# Patient Record
Sex: Male | Born: 2000 | Race: Black or African American | Hispanic: No | Marital: Single | Smoking: Former smoker
Health system: Southern US, Community
[De-identification: ages and names within clinical notes are randomized; demographics above are authoritative.]

## PROBLEM LIST (undated history)

## (undated) DIAGNOSIS — J45909 Unspecified asthma, uncomplicated: Secondary | ICD-10-CM

---

## 2021-04-03 ENCOUNTER — Emergency Department: Payer: Self-pay

## 2021-04-03 ENCOUNTER — Other Ambulatory Visit: Payer: Self-pay

## 2021-04-03 ENCOUNTER — Encounter: Payer: Self-pay | Admitting: Emergency Medicine

## 2021-04-03 ENCOUNTER — Emergency Department
Admission: EM | Admit: 2021-04-03 | Discharge: 2021-04-03 | Disposition: A | Discharge status: Home / Self Care | Payer: Self-pay | Attending: Emergency Medicine | Admitting: Emergency Medicine

## 2021-04-03 DIAGNOSIS — Z87891 Personal history of nicotine dependence: Secondary | ICD-10-CM | POA: Insufficient documentation

## 2021-04-03 DIAGNOSIS — J4521 Mild intermittent asthma with (acute) exacerbation: Secondary | ICD-10-CM | POA: Insufficient documentation

## 2021-04-03 HISTORY — DX: Unspecified asthma, uncomplicated: J45.909

## 2021-04-03 LAB — BASIC METABOLIC PANEL
Anion gap: 8 (ref 5–15)
BUN: 14 mg/dL (ref 6–20)
CO2: 23 mmol/L (ref 22–32)
Calcium: 9.7 mg/dL (ref 8.9–10.3)
Chloride: 102 mmol/L (ref 98–111)
Creatinine, Ser: 0.94 mg/dL (ref 0.61–1.24)
GFR, Estimated: >60 mL/min (ref >60)
Glucose, Bld: 115 mg/dL — ABNORMAL HIGH (ref 70–99)
Potassium: 3.5 mmol/L (ref 3.5–5.1)
Sodium: 133 mmol/L — ABNORMAL LOW (ref 135–145)

## 2021-04-03 LAB — CBC
HCT: 42.1 % (ref 39.0–52.0)
Hemoglobin: 14.4 g/dL (ref 13.0–17.0)
MCH: 30.2 pg (ref 26.0–34.0)
MCHC: 34.2 g/dL (ref 30.0–36.0)
MCV: 88.3 fL (ref 80.0–100.0)
Platelets: 339 10*3/uL (ref 150–400)
RBC: 4.77 MIL/uL (ref 4.22–5.81)
RDW: 12.3 % (ref 11.5–15.5)
WBC: 7.5 10*3/uL (ref 4.0–10.5)
nRBC: 0 % (ref 0.0–0.2)

## 2021-04-03 LAB — TROPONIN I (HIGH SENSITIVITY): Troponin I (High Sensitivity): 5 ng/L (ref <18)

## 2021-04-03 MED ORDER — DEXAMETHASONE 10 MG/ML FOR PEDIATRIC ORAL USE
10.0000 mg | Freq: Once | INTRAMUSCULAR | Status: AC
  Administered 2021-04-03: 05:00:00 10 mg via ORAL
  Filled 2021-04-03: qty 1

## 2021-04-03 MED ORDER — ALBUTEROL SULFATE HFA 108 (90 BASE) MCG/ACT IN AERS
2.0000 | INHALATION_SPRAY | RESPIRATORY_TRACT | Status: DC | PRN
  Administered 2021-04-03: 03:00:00 2 via RESPIRATORY_TRACT
  Filled 2021-04-03: qty 6.7

## 2021-04-03 MED ORDER — ALBUTEROL SULFATE HFA 108 (90 BASE) MCG/ACT IN AERS: INHALATION_SPRAY | RESPIRATORY_TRACT | 0 refills | Status: AC

## 2021-04-03 NOTE — ED Triage Notes (Signed)
Pt in with sharp, central cp along with sob that began about 30 min PTA while he was at work. States he smelt, "something weird" and he thinks it triggered his asthma. Pain to chest worse with palpitation and deep breaths.

## 2021-04-03 NOTE — ED Provider Notes (Signed)
[facility] Emergency Department Provider Note  ____________________________________________   Event Date/Time   First MD Initiated Contact with Patient 04/03/21 [phone]     (approximate)  I have reviewed the triage vital signs and the nursing notes.   HISTORY  Chief Complaint Shortness of Breath and Chest Pain    HPI Brandon Everett is a 20 y.o. male with a history of asthma who presents for evaluation of acute onset shortness of breath and chest tightness.  It occurred while he was at work after he smelled something that another coworker had and he thinks may have triggered an asthma attack.  He no longer had an albuterol inhaler and has not had problems with his asthma for a while but he has had it since he was a child.  He still felt bad when he came to the emergency department but he was given an albuterol inhaler and after using it he feels much better.  He no longer has chest pain or tightness.  He no longer feels like he is wheezing.  The albuterol made him feel better, exertion made him feel worse.  He denies recent fever, sore throat, nausea, vomiting, and abdominal pain.     Past Medical History:  Diagnosis Date   Asthma     There are no problems to display for this patient.   History reviewed. No pertinent surgical history.  Prior to Admission medications   Medication Sig Start Date End Date Taking? Authorizing Provider  albuterol (VENTOLIN HFA) 108 (90 Base) MCG/ACT inhaler Inhale 2-4 puffs by mouth every 4 hours as needed for wheezing, cough, and/or shortness of breath 04/03/21  Yes Loleta Rose, MD    Allergies Penicillins  No family history on file.  Social History Social History   Tobacco Use   Smoking status: Former    Types: Cigarettes   Smokeless tobacco: Never  Vaping Use   Vaping Use: Every day  Substance Use Topics   Alcohol use: Yes    Comment: occasional    Review of Systems Constitutional: No  fever/chills Eyes: No visual changes. ENT: No sore throat. Cardiovascular: Acute onset chest tightness associated with shortness of breath.   GI:  No abd pain, no N/V Respiratory: Acute onset shortness of breath. Neurological: Negative for headaches, focal weakness or numbness.   ____________________________________________   PHYSICAL EXAM:  VITAL SIGNS: ED Triage Vitals  Enc Vitals Group     BP 04/03/21 0047 136/86     Pulse Rate 04/03/21 0047 71     Resp 04/03/21 0047 20     Temp 04/03/21 0047 98.9 F (37.2 C)     Temp Source 04/03/21 0047 Axillary     SpO2 04/03/21 0047 98 %     Weight 04/03/21 0050 104.3 kg (230 lb)     Height --      Head Circumference --      Peak Flow --      Pain Score --      Pain Loc --      Pain Edu? --      Excl. in GC? --     Constitutional: Alert and oriented.  Eyes: Conjunctivae are normal.  Cardiovascular: Normal rate, regular rhythm. Good peripheral circulation. Respiratory: Normal respiratory effort.  No retractions. Gastrointestinal: Soft and nontender. No distention.  Musculoskeletal: No lower extremity tenderness nor edema. No gross deformities of extremities. Neurologic:  Normal speech and language. No gross focal neurologic deficits are appreciated.  Skin:  Skin is  warm, dry and intact. Psychiatric: Mood and affect are normal. Speech and behavior are normal.  ____________________________________________   LABS (all labs ordered are listed, but only abnormal results are displayed)  Labs Reviewed  BASIC METABOLIC PANEL - Abnormal; Notable for the following components:      Result Value   Sodium 133 (*)    Glucose, Bld 115 (*)    All other components within normal limits  CBC  TROPONIN I (HIGH SENSITIVITY)   ____________________________________________  EKG  ED ECG REPORT I, Loleta Rose, the attending physician, personally viewed and interpreted this ECG.  Date: 04/03/2021 EKG Time: 00:52 Rate: 68 Rhythm: normal  sinus rhythm QRS Axis: normal Intervals: normal ST/T Wave abnormalities: normal Narrative Interpretation: no evidence of acute ischemia  ____________________________________________  RADIOLOGY I, Loleta Rose, personally viewed and evaluated these images (plain radiographs) as part of my medical decision making, as well as reviewing the written report by the radiologist.  ED MD interpretation: No acute abnormalities on chest x-ray  Official radiology report(s): DG Chest 2 View  Result Date: 04/03/2021 CLINICAL DATA:  Chest pain and shortness of breath. EXAM: CHEST - 2 VIEW COMPARISON:  None. FINDINGS: The heart size and mediastinal contours are within normal limits. Mildly decreased lung volumes are seen. Both lungs are clear. The visualized skeletal structures are unremarkable. IMPRESSION: No active cardiopulmonary disease. Electronically Signed   By: Aram Candela M.D.   On: 04/03/2021 01:15    ____________________________________________   INITIAL IMPRESSION / MDM / ASSESSMENT AND PLAN / ED COURSE  As part of my medical decision making, I reviewed the following data within the electronic MEDICAL RECORD NUMBER Nursing notes reviewed and incorporated, Labs reviewed , EKG interpreted , Old chart reviewed, Radiograph reviewed , and Notes from prior ED visits   Differential diagnosis includes, but is not limited to, asthma exacerbation, chemical pneumonitis, viral infection.  Patient is well-appearing and in no distress.  He was asymptomatic prior to experiencing a triggering smell and then he felt like he had an asthma exacerbation.  He feels completely better after albuterol with an inhaler which he will be able to take home with him.  Vital signs are stable and within normal limits.  EKG shows no sign of ischemia.  I personally reviewed the patient's imaging and agree with the radiologist's interpretation that there are no acute abnormalities on his chest x-ray.  Basic metabolic panel,  CBC, and high-sensitivity troponin are within normal limits.  Patient is comfortable with the plan for discharge and outpatient follow-up and I provided to him with some clinic names with which she can establish primary care.  I gave a dose of Decadron 10 mg by mouth prior to discharge to help with what may be a mild intermittent asthma exacerbation.  I gave my usual and customary return precautions and he understands and agrees with the plan.           ____________________________________________  FINAL CLINICAL IMPRESSION(S) / ED DIAGNOSES  Final diagnoses:  Mild intermittent asthma with exacerbation     MEDICATIONS GIVEN DURING THIS VISIT:  Medications  albuterol (VENTOLIN HFA) 108 (90 Base) MCG/ACT inhaler 2 puff (2 puffs Inhalation Given 04/03/21 0255)  dexamethasone (DECADRON) 10 MG/ML injection for Pediatric ORAL use 10 mg (10 mg Oral Given 04/03/21 0450)     ED Discharge Orders          Ordered    albuterol (VENTOLIN HFA) 108 (90 Base) MCG/ACT inhaler       Note  to Pharmacy: Pharmacy may substitute brand and size for insurance-approved equivalent   04/03/21 0446             Note:  This document was prepared using Dragon voice recognition software and may include unintentional dictation errors.   Loleta Rose, MD 04/03/21 [phone]

## 2021-05-12 ENCOUNTER — Ambulatory Visit: Payer: Self-pay

## 2021-05-16 ENCOUNTER — Encounter: Payer: Self-pay | Admitting: Emergency Medicine

## 2021-05-16 ENCOUNTER — Other Ambulatory Visit: Payer: Self-pay

## 2021-05-16 ENCOUNTER — Emergency Department
Admission: EM | Admit: 2021-05-16 | Discharge: 2021-05-16 | Disposition: A | Discharge status: Home / Self Care | Payer: Self-pay | Attending: Emergency Medicine | Admitting: Emergency Medicine

## 2021-05-16 DIAGNOSIS — L03213 Periorbital cellulitis: Secondary | ICD-10-CM | POA: Insufficient documentation

## 2021-05-16 DIAGNOSIS — J45909 Unspecified asthma, uncomplicated: Secondary | ICD-10-CM | POA: Insufficient documentation

## 2021-05-16 MED ORDER — SULFAMETHOXAZOLE-TRIMETHOPRIM 800-160 MG PO TABS: 1.0000 | ORAL_TABLET | Freq: Two times a day (BID) | ORAL | 0 refills | Status: AC

## 2021-05-16 MED ORDER — AMOXICILLIN-POT CLAVULANATE 875-125 MG PO TABS: 1.0000 | ORAL_TABLET | Freq: Two times a day (BID) | ORAL | 0 refills | Status: AC

## 2021-05-16 MED ORDER — AMOXICILLIN-POT CLAVULANATE 875-125 MG PO TABS
1.0000 | ORAL_TABLET | Freq: Once | ORAL | Status: AC
  Administered 2021-05-16: 1 via ORAL
  Filled 2021-05-16: qty 1

## 2021-05-16 MED ORDER — SULFAMETHOXAZOLE-TRIMETHOPRIM 800-160 MG PO TABS
1.0000 | ORAL_TABLET | Freq: Once | ORAL | Status: AC
  Administered 2021-05-16: 1 via ORAL
  Filled 2021-05-16: qty 1

## 2021-05-16 NOTE — ED Notes (Signed)
See triage note  presents with swelling to right eye and cheek area  states he popped a "bump" under eye yesterday  woke up with swelling to eye this am   afebrile on arrival

## 2021-05-16 NOTE — Discharge Instructions (Addendum)
You likely have a infection of the skin around the eye. Please take the 2 antibiotics twice a day for the next 5 days.  Please also try warm compresses on the eye.  If the redness is increasing or you develop any double vision, please return to the emergency department.

## 2021-05-16 NOTE — ED Triage Notes (Signed)
Pt via POV from home. Pt c/o eye swelling, pt states he popped a bump on his eye last night. Pt woke up with L eye swelling. No redness in the eye noted. Pt states it is causing pain but denies any vision changes. Pt is A&Ox4 and NAD

## 2021-05-16 NOTE — ED Provider Notes (Signed)
[facility] Provider Note    Event Date/Time   First MD Initiated Contact with Patient 05/16/21 [phone]     (approximate)   History   Eye Problem   HPI  Brandon Everett is a 21 y.o. male  with pmh of asthma who presents with eye pain and swelling.  Patient popped what he thought was a pimple on the right cheek last night.  This morning he woke up and the right eye was swollen.  Denies visual change.  There is no significant pain associated with it.  Denies fevers or chills.  He notes that when he did pop the pimple there was pus.    Past Medical History:  Diagnosis Date   Asthma     There are no problems to display for this patient.    Physical Exam  Triage Vital Signs: ED Triage Vitals  Enc Vitals Group     BP 05/16/21 0931 (!) 144/83     Pulse Rate 05/16/21 0931 74     Resp 05/16/21 0931 20     Temp 05/16/21 0931 98.4 F (36.9 C)     Temp Source 05/16/21 0931 Oral     SpO2 05/16/21 0931 98 %     Weight 05/16/21 0930 230 lb (104.3 kg)     Height 05/16/21 0930 5\' 8"  (1.727 m)     Head Circumference --      Peak Flow --      Pain Score 05/16/21 0930 7     Pain Loc --      Pain Edu? --      Excl. in [place]? --     Most recent vital signs: Vitals:   05/16/21 0931  BP: (!) 144/83  Pulse: 74  Resp: 20  Temp: 98.4 F (36.9 C)  SpO2: 98%     General: Awake, no distress.  CV:  Good peripheral perfusion.  Resp:  Normal effort.  Abd:  No distention.  Neuro:             Awake, Alert, Oriented x 3  Other:  Periorbital swelling on the right side, no significant erythema or warmth, no conjunctival injection, PERRLA, extraocular movements are intact, no pain with extraocular movements, no proptosis   ED Results / Procedures / Treatments  Labs (all labs ordered are listed, but only abnormal results are displayed) Labs Reviewed - No data to display   EKG     RADIOLOGY    PROCEDURES:  Critical Care performed: No    MEDICATIONS  ORDERED IN ED: Medications  sulfamethoxazole-trimethoprim (BACTRIM DS) 800-160 MG per tablet 1 tablet (has no administration in time range)  amoxicillin-clavulanate (AUGMENTIN) 875-125 MG per tablet 1 tablet (1 tablet Oral Given 05/16/21 0952)     IMPRESSION / MDM / ASSESSMENT AND PLAN / ED COURSE  I reviewed the triage vital signs and the nursing notes.                              Differential diagnosis includes, but is not limited to, preseptal cellulitis, orbital cellulitis, allergic reaction  21 year old male presents with right eye swelling.  He popped the pimple that was adjacent to the eye on the right cheek last night and then woke up with right eye swelling today.  On exam he does have periorbital swelling without significant erythema or tenderness.  There is no proptosis pain with extraocular movements or restricted extraocular movements.  Exam is most consistent with periorbital cellulitis.  I am not concerned for orbital cellulitis at this point.  Reviewed up-to-date recommendations for antibiotics, given he did have potential periorbital skin trauma I do recommend double coverage with Bactrim and Augmentin.  Patient has penicillin allergy listed but he was just told that he had this as a child does not recall or know what the reaction was.  We will give him a dose of Augmentin in the ED and observed.  Will discharge with 7 days of Bactrim and Augmentin.  We discussed return precautions for worsening despite antibiotics.     FINAL CLINICAL IMPRESSION(S) / ED DIAGNOSES   Final diagnoses:  Periorbital cellulitis of right eye     Rx / DC Orders   ED Discharge Orders     None        Note:  This document was prepared using Dragon voice recognition software and may include unintentional dictation errors.   Rada Hay, MD 05/16/21 [phone]

## 2021-10-13 ENCOUNTER — Other Ambulatory Visit: Payer: Self-pay

## 2021-10-13 ENCOUNTER — Emergency Department
Admission: EM | Admit: 2021-10-13 | Discharge: 2021-10-13 | Disposition: A | Discharge status: Home / Self Care | Payer: Self-pay | Attending: Emergency Medicine | Admitting: Emergency Medicine

## 2021-10-13 ENCOUNTER — Encounter: Payer: Self-pay | Admitting: *Deleted

## 2021-10-13 DIAGNOSIS — H6121 Impacted cerumen, right ear: Secondary | ICD-10-CM | POA: Insufficient documentation

## 2021-10-13 DIAGNOSIS — L0232 Furuncle of buttock: Secondary | ICD-10-CM | POA: Insufficient documentation

## 2021-10-13 DIAGNOSIS — H9201 Otalgia, right ear: Secondary | ICD-10-CM

## 2021-10-13 MED ORDER — SULFAMETHOXAZOLE-TRIMETHOPRIM 800-160 MG PO TABS
1.0000 | ORAL_TABLET | Freq: Once | ORAL | Status: AC
  Administered 2021-10-13: 1 via ORAL
  Filled 2021-10-13: qty 1

## 2021-10-13 MED ORDER — SULFAMETHOXAZOLE-TRIMETHOPRIM 800-160 MG PO TABS: 1.0000 | ORAL_TABLET | Freq: Two times a day (BID) | ORAL | 0 refills | Status: AC

## 2021-10-13 NOTE — Discharge Instructions (Signed)
Please soak in Epsom salt baths daily.  Take antibiotics as prescribed.  Follow-up with general surgery if no improvement in 1 week.  Use mineral oil to soften wax in your right ear and flush daily with warm water.  Return to the ER for any worsening symptoms or urgent changes in your health.

## 2021-10-13 NOTE — ED Triage Notes (Signed)
Pt has right earache.  Pt has keloid on right buttock and states he is having a flare up.  Pt alert

## 2021-10-13 NOTE — ED Provider Notes (Signed)
[facility] EMERGENCY DEPARTMENT Provider Note   CSN: [id number] Arrival date & time: 10/13/21  1513     History  No chief complaint on file.   Brandon Everett is a 21 y.o. male.  Presents to the emergency department for evaluation of painful keloids on his buttocks.  Patient states he had some cyst removed on his buttocks several years ago in [place], has had recurring swelling along the area of the surgery, states he has keloid formation.  Sometimes these are sore and bleeding but they never go away.  He denies any fevers, has had some bloody drainage to both areas.  Mild tenderness to touch without any significant redness.  He denies any purulent drainage.  He also complains of right ear discomfort, has pain in the right ear with no trauma or injury.  Denies any rashes.  No fevers or chills.  No ear drainage.  States he feels as if there is something in his right ear canal.  HPI     Home Medications Prior to Admission medications   Medication Sig Start Date End Date Taking? Authorizing Provider  sulfamethoxazole-trimethoprim (BACTRIM DS) 800-160 MG tablet Take 1 tablet by mouth 2 (two) times daily for 7 days. 10/13/21 10/20/21 Yes Evon Slack, PA-C  albuterol (VENTOLIN HFA) 108 (90 Base) MCG/ACT inhaler Inhale 2-4 puffs by mouth every 4 hours as needed for wheezing, cough, and/or shortness of breath 04/03/21   Loleta Rose, MD      Allergies    Penicillins    Review of Systems   Review of Systems  Physical Exam Updated Vital Signs BP (!) 129/91 (BP Location: Left Arm)   Pulse 85   Temp 98.2 F (36.8 C) (Oral)   Resp 17   Ht 5\' 8"  (1.727 m)   Wt 104.3 kg   SpO2 97%   BMI 34.97 kg/m  Physical Exam Constitutional:      Appearance: He is well-developed.  HENT:     Head: Normocephalic and atraumatic.     Ears:     Comments: Right TM normal, moderate impaction of hard cerumen.  Canal normal.  Left ear canal normal with normal TM. Eyes:      Conjunctiva/sclera: Conjunctivae normal.  Cardiovascular:     Rate and Rhythm: Normal rate.  Pulmonary:     Effort: Pulmonary effort is normal. No respiratory distress.  Genitourinary:    Comments: Buttocks with a left gluteal and right gluteal soft tissue mass that is slightly fluctuant and tender to palpation, minimal surrounding erythema with no significant warmth.  Minimal bloody drainage, no purulent drainage.  No perirectal abscesses Musculoskeletal:        General: Normal range of motion.     Cervical back: Normal range of motion.  Skin:    General: Skin is warm.     Findings: No rash.  Neurological:     Mental Status: He is alert and oriented to person, place, and time.  Psychiatric:        Behavior: Behavior normal.        Thought Content: Thought content normal.     ED Results / Procedures / Treatments   Labs (all labs ordered are listed, but only abnormal results are displayed) Labs Reviewed - No data to display  EKG None  Radiology No results found.  Procedures Procedures    Medications Ordered in ED Medications  sulfamethoxazole-trimethoprim (BACTRIM DS) 800-160 MG per tablet 1 tablet (has no administration in time range)  ED Course/ Medical Decision Making/ A&P                           Medical Decision Making Risk Prescription drug management.  21 year old male with chronic bilateral gluteal region soft tissue masses that he describes as keloids.  These become sensitive and painful.  Minimal surrounding erythema.  No extensive cellulitis or large abscess present.  Patient placed on oral antibiotics, Bactrim.  He also has right ear discomfort, has a large amount of cerumen impacted in the right ear.  This was 75% removed with warm water irrigation.  Patient tolerated procedure well.  He did see improvement with pain with cerumen removal.  He is educated on continuing with mineral oil and warm water flushing daily to help remove the remainder of the  wax.  Patient with no signs of TM rupture or infection throughout the right ear. Final Clinical Impression(s) / ED Diagnoses Final diagnoses:  Boil of buttock  Right ear pain  Hearing loss due to cerumen impaction, right    Rx / DC Orders ED Discharge Orders          Ordered    sulfamethoxazole-trimethoprim (BACTRIM DS) 800-160 MG tablet  2 times daily        10/13/21 1901              Ronnette Juniper 10/13/21 Jola Babinski, MD 10/13/21 1946

## 2021-10-31 ENCOUNTER — Other Ambulatory Visit: Payer: Self-pay

## 2021-10-31 DIAGNOSIS — Z5321 Procedure and treatment not carried out due to patient leaving prior to being seen by health care provider: Secondary | ICD-10-CM | POA: Insufficient documentation

## 2021-10-31 DIAGNOSIS — L0231 Cutaneous abscess of buttock: Secondary | ICD-10-CM | POA: Insufficient documentation

## 2021-10-31 NOTE — ED Provider Triage Note (Signed)
  Emergency Medicine Provider Triage Evaluation Note  Brandon Everett , a 21 y.o.male,  was evaluated in triage.  Pt complains of multiple abscesses.  Patient states that has been a chronic problem for the past several years.  He is here today because he is now at 1 grow on his buttocks.  He was recently prescribed antibiotics, which she says did not help.  Denies any other symptoms.   Review of Systems  Positive: Abscesses Negative: Denies fever, chest pain, vomiting  Physical Exam   Vitals:   10/31/21 2056  BP: (!) 145/92  Pulse: 74  Resp: 20  Temp: 98.6 F (37 C)  SpO2: 98%   Gen:   Awake, no distress   Resp:  Normal effort  MSK:   Moves extremities without difficulty  Other:    Medical Decision Making  Given the patient's initial medical screening exam, the following diagnostic evaluation has been ordered. The patient will be placed in the appropriate treatment space, once one is available, to complete the evaluation and treatment. I have discussed the plan of care with the patient and I have advised the patient that an ED physician or mid-level practitioner will reevaluate their condition after the test results have been received, as the results may give them additional insight into the type of treatment they may need.    Diagnostics: None immediately.  Treatments: none immediately   Varney Daily, [place] 10/31/21 2110

## 2021-10-31 NOTE — ED Triage Notes (Signed)
Pt has multiple bumps all over. Pt states he gets them all the time he currently has them on his inner thighs, buttocks, forerhead, and behind his ear. Pt states they are painful.

## 2021-11-01 ENCOUNTER — Emergency Department
Admission: EM | Admit: 2021-11-01 | Discharge: 2021-11-01 | Discharge status: Against Medical Advice | Payer: Self-pay | Attending: Emergency Medicine | Admitting: Emergency Medicine

## 2021-11-01 NOTE — ED Notes (Signed)
No answer when called several times from lobby 

## 2021-11-02 ENCOUNTER — Encounter: Payer: Self-pay | Admitting: Emergency Medicine

## 2021-11-02 ENCOUNTER — Other Ambulatory Visit: Payer: Self-pay

## 2021-11-02 ENCOUNTER — Emergency Department
Admission: EM | Admit: 2021-11-02 | Discharge: 2021-11-02 | Disposition: A | Discharge status: Home / Self Care | Payer: Self-pay | Attending: Emergency Medicine | Admitting: Emergency Medicine

## 2021-11-02 DIAGNOSIS — J45909 Unspecified asthma, uncomplicated: Secondary | ICD-10-CM | POA: Insufficient documentation

## 2021-11-02 DIAGNOSIS — Z87891 Personal history of nicotine dependence: Secondary | ICD-10-CM | POA: Insufficient documentation

## 2021-11-02 DIAGNOSIS — L732 Hidradenitis suppurativa: Secondary | ICD-10-CM | POA: Insufficient documentation

## 2021-11-02 MED ORDER — HYDROCODONE-ACETAMINOPHEN 5-325 MG PO TABS: 1.0000 | ORAL_TABLET | Freq: Four times a day (QID) | ORAL | 0 refills | Status: AC | PRN

## 2021-11-02 MED ORDER — CLINDAMYCIN HCL 300 MG PO CAPS: 300.0000 mg | ORAL_CAPSULE | Freq: Three times a day (TID) | ORAL | 0 refills | Status: AC

## 2021-11-02 NOTE — ED Provider Notes (Signed)
[facility] Provider Note    Event Date/Time   First MD Initiated Contact with Patient 11/02/21 0809     (approximate)   History   Abscess   HPI  Brandon Everett is a 21 y.o. male   presents with complaint of multiple boils all over his body.  Patient states this been going on for a long time and he just moved to this area needs to be seen by dermatologist.  He states in the past he has had multiple areas opened and drained.  He denies any fever or chills.  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  Patient has a history of asthma and is a former cigarette smoker.      Physical Exam   Triage Vital Signs: ED Triage Vitals  Enc Vitals Group     BP 11/02/21 0757 128/75     Pulse Rate 11/02/21 0757 63     Resp 11/02/21 0757 18     Temp 11/02/21 0757 98.4 F (36.9 C)     Temp Source 11/02/21 0757 Oral     SpO2 11/02/21 0757 97 %     Weight 11/02/21 0753 229 lb 15 oz (104.3 kg)     Height 11/02/21 0753 5\' 8"  (1.727 m)     Head Circumference --      Peak Flow --      Pain Score 11/02/21 0753 7     Pain Loc --      Pain Edu? --      Excl. in [place]? --     Most recent vital signs: Vitals:   11/02/21 0757 11/02/21 0915  BP: 128/75 124/78  Pulse: 63 68  Resp: 18 16  Temp: 98.4 F (36.9 C)   SpO2: 97% 99%     General: Awake, no distress.  CV:  Good peripheral perfusion.  Resp:  Normal effort.  Abd:  No distention.  Other:  There are multiple chronic appearing papular areas some with scarring on the face, buttocks, upper and lower extremities.  At this time they are all hard, minimal erythema and no fluctuance is noted.  Areas on the buttocks appear to be chronic and have opened in the past.   ED Results / Procedures / Treatments   Labs (all labs ordered are listed, but only abnormal results are displayed) Labs Reviewed - No data to display     PROCEDURES:  Critical Care performed:   Procedures   MEDICATIONS  ORDERED IN ED: Medications - No data to display   IMPRESSION / MDM / ASSESSMENT AND PLAN / ED COURSE  I reviewed the triage vital signs and the nursing notes.   Differential diagnosis includes, but is not limited to, abscess, hydradenitis, folliculitis, contact dermatitis.  21 year old male presents to the ED with chronic history of abscesses over his entire body.  Patient has had several these opened in the past.  He just moved to this area and wishes to be referred to a dermatologist for further treatment of his skin condition.  Areas on his buttocks appear to be chronic but are tender to palpation.  No fluctuance is noted.  We discussed the 2 offices in [place] and that he should call make an appointment.  He is aware that it may be several months before they get him in his he will be a new patient.  A prescription for clindamycin was sent to the pharmacy along with 2 days of hydrocodone as  needed for pain.  He is aware that he cannot drive or operate machinery while taking this medication and is for moderate to severe pain only.  He is encouraged to take Tylenol or ibuprofen if he is working.  Warm moist compresses or sitting in a tub of water.      Patient's presentation is most consistent with acute, uncomplicated illness.  FINAL CLINICAL IMPRESSION(S) / ED DIAGNOSES   Final diagnoses:  Hydradenitis     Rx / DC Orders   ED Discharge Orders          Ordered    clindamycin (CLEOCIN) 300 MG capsule  3 times daily        11/02/21 0901    HYDROcodone-acetaminophen (NORCO/VICODIN) 5-325 MG tablet  Every 6 hours PRN        11/02/21 0901             Note:  This document was prepared using Dragon voice recognition software and may include unintentional dictation errors.   Tommi Rumps, PA-C 11/02/21 1010    Arnaldo Natal, MD 11/02/21 [phone]

## 2021-11-02 NOTE — ED Triage Notes (Signed)
C/O multiple boils to body -- to right buttock, forehead, behind ear.  States areas are painful.  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  Symptoms worsening over past month.

## 2021-11-02 NOTE — ED Notes (Signed)
Pt reports multiple abscesses all over his body x1 year with hx of same since he was a child.

## 2021-11-02 NOTE — Discharge Instructions (Addendum)
Call make an appointment at one of the dermatology offices listed on your discharge papers.  Each dermatology office has more than 1 dermatologist.  It may be a month or 2 before that you get an appointment as you will be a new patient and you need to go ahead and schedule as this is a chronic condition.  You may put warm moist compresses on the largest areas or sit in a tub of water which will also help.  None of these today need to be opened up and drained.  The antibiotic is 3 times a day for the next 10 days.  You may take Tylenol or ibuprofen as needed for pain after finishing the hydrocodone that was sent to the pharmacy for the next 2 days.  Do not drive or operate machinery while taking this medication take only if there is moderate to severe pain.

## 2023-06-09 IMAGING — CR DG CHEST 2V
1 series · 2 of 2 positions shown · non-contrast
Comparison: None.

CLINICAL DATA: Chest pain and shortness of breath.

EXAM:
CHEST - 2 VIEW

[Series 1: dg chest 2 view · 0.14mm/px · 2 of 2 slices shown]
[im 1/2]
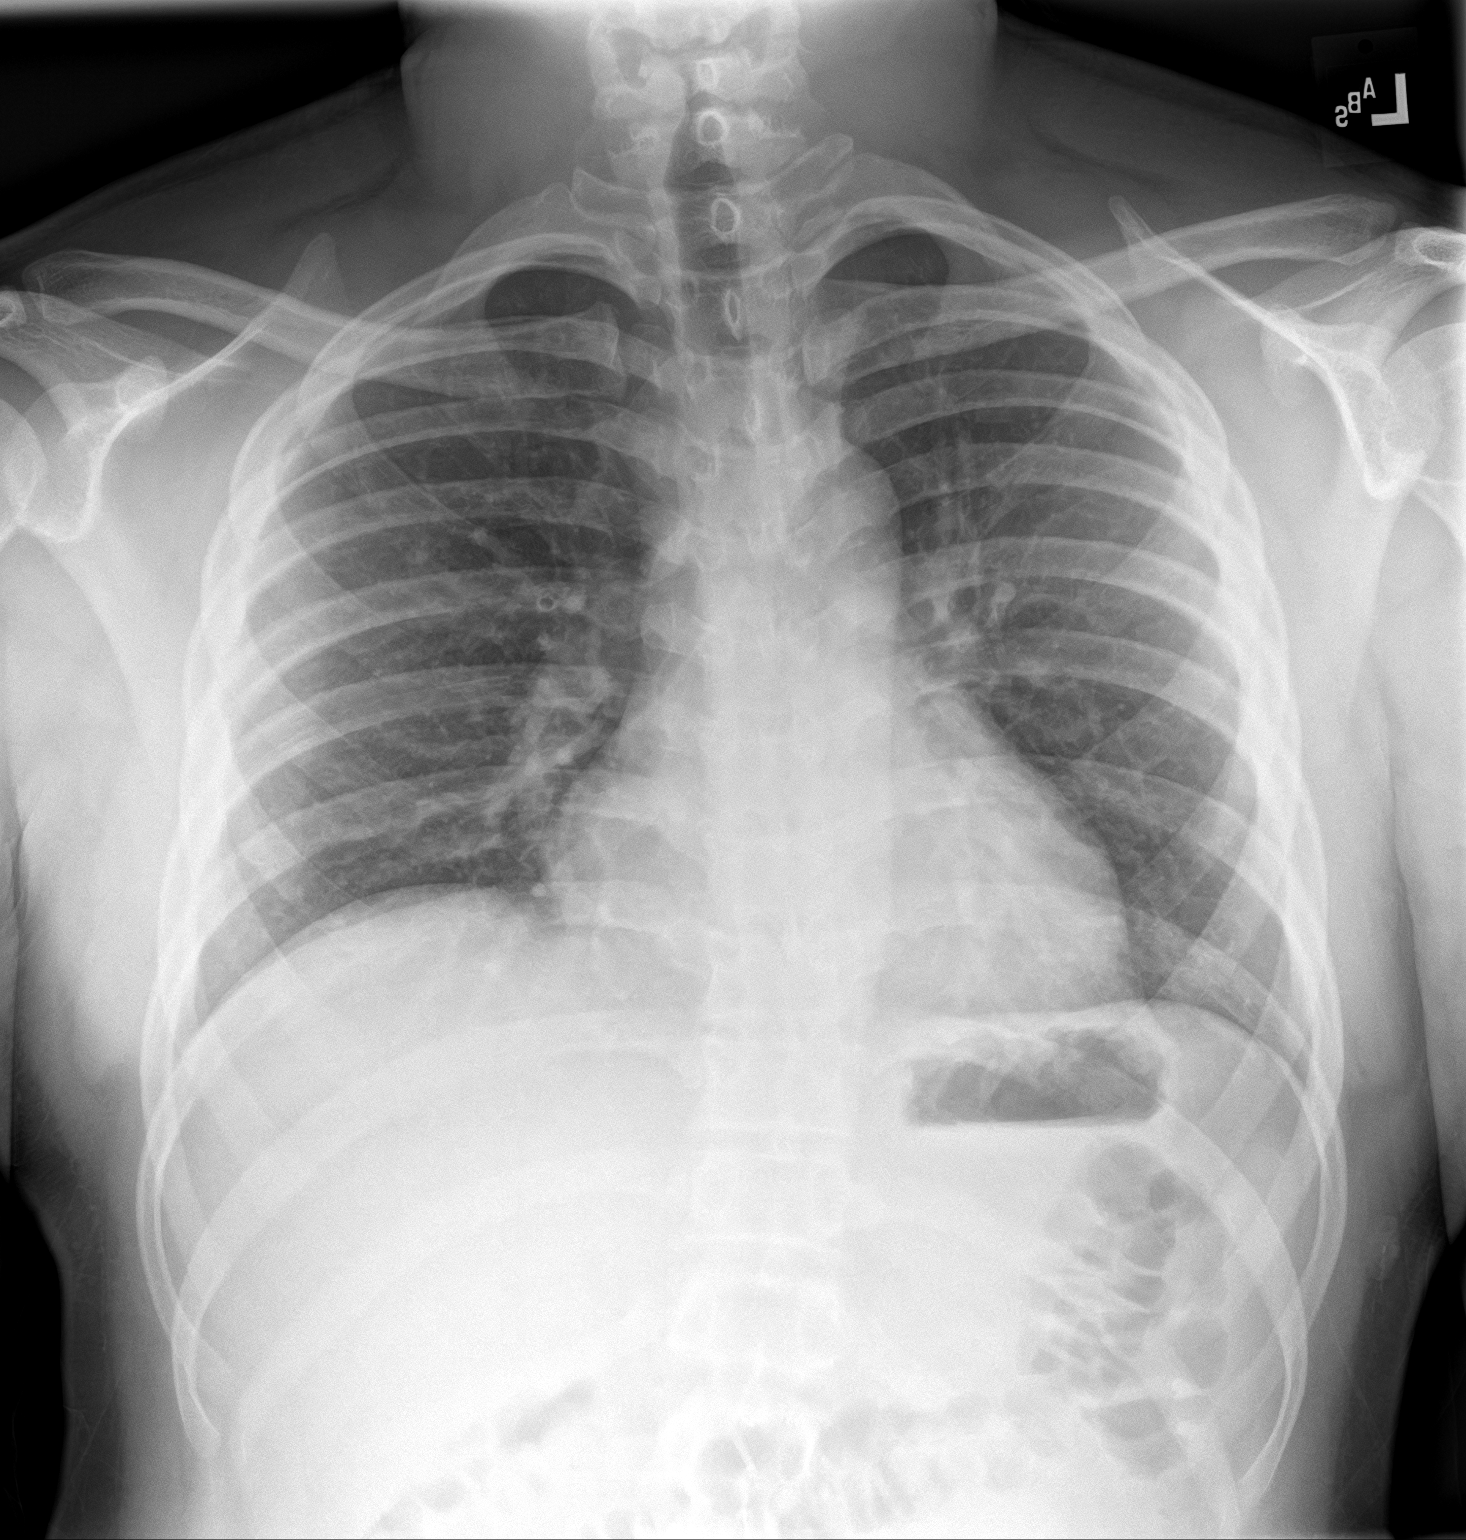
[im 2/2]
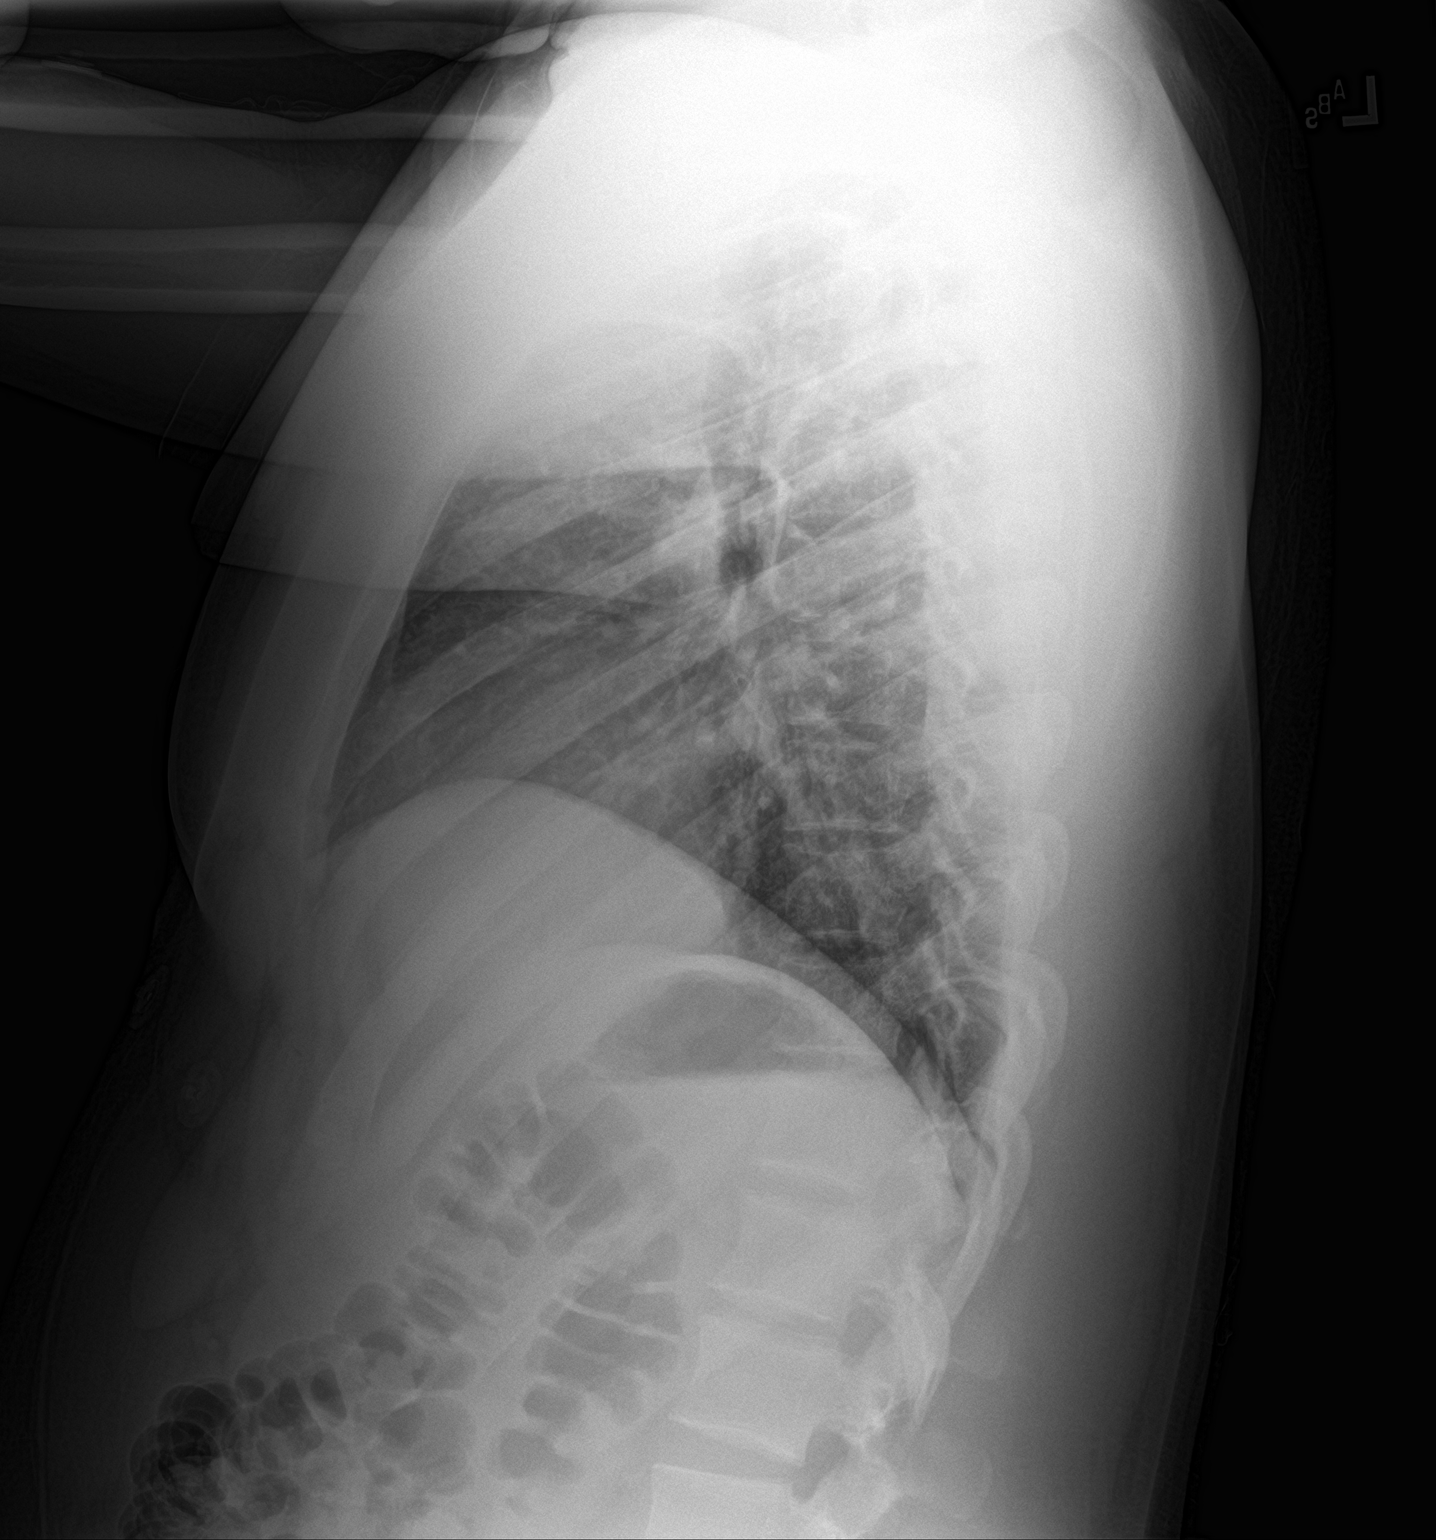

[2 of 2 positions shown; findings below may reference images not displayed]

FINDINGS: The heart size and mediastinal contours are within normal limits.
Mildly decreased lung volumes are seen. Both lungs are clear. The
visualized skeletal structures are unremarkable.
IMPRESSION: No active cardiopulmonary disease.
